# Patient Record
Sex: Male | Born: 1970 | Race: Black or African American | Hispanic: No | Smoking: Former smoker
Health system: Southern US, Community
[De-identification: ages and names within clinical notes are randomized; demographics above are authoritative.]

## PROBLEM LIST (undated history)

## (undated) HISTORY — PX: MOLE REMOVAL: SHX2046

---

## 2015-06-14 ENCOUNTER — Emergency Department (HOSPITAL_COMMUNITY)

## 2015-06-14 ENCOUNTER — Emergency Department (HOSPITAL_COMMUNITY)
Admission: EM | Admit: 2015-06-14 | Discharge: 2015-06-14 | Disposition: A | Discharge status: Home / Self Care | Attending: Emergency Medicine | Admitting: Emergency Medicine

## 2015-06-14 ENCOUNTER — Encounter (HOSPITAL_COMMUNITY): Payer: Self-pay | Admitting: Emergency Medicine

## 2015-06-14 DIAGNOSIS — Z87891 Personal history of nicotine dependence: Secondary | ICD-10-CM | POA: Diagnosis not present

## 2015-06-14 DIAGNOSIS — R109 Unspecified abdominal pain: Secondary | ICD-10-CM

## 2015-06-14 DIAGNOSIS — R1084 Generalized abdominal pain: Secondary | ICD-10-CM | POA: Diagnosis not present

## 2015-06-14 DIAGNOSIS — J159 Unspecified bacterial pneumonia: Secondary | ICD-10-CM | POA: Insufficient documentation

## 2015-06-14 DIAGNOSIS — R112 Nausea with vomiting, unspecified: Secondary | ICD-10-CM

## 2015-06-14 DIAGNOSIS — J189 Pneumonia, unspecified organism: Secondary | ICD-10-CM

## 2015-06-14 DIAGNOSIS — R197 Diarrhea, unspecified: Secondary | ICD-10-CM

## 2015-06-14 LAB — COMPREHENSIVE METABOLIC PANEL
ALK PHOS: 56 U/L (ref 38–126)
ALT: 34 U/L (ref 17–63)
AST: 80 U/L — AB (ref 15–41)
Albumin: 3.7 g/dL (ref 3.5–5.0)
Anion gap: 11 (ref 5–15)
BILIRUBIN TOTAL: 0.4 mg/dL (ref 0.3–1.2)
BUN: 12 mg/dL (ref 6–20)
CALCIUM: 9 mg/dL (ref 8.9–10.3)
CO2: 23 mmol/L (ref 22–32)
CREATININE: 1.3 mg/dL — AB (ref 0.61–1.24)
Chloride: 98 mmol/L — ABNORMAL LOW (ref 101–111)
GFR calc Af Amer: >60 mL/min (ref >60)
Glucose, Bld: 135 mg/dL — ABNORMAL HIGH (ref 65–99)
POTASSIUM: 3.1 mmol/L — AB (ref 3.5–5.1)
Sodium: 132 mmol/L — ABNORMAL LOW (ref 135–145)
TOTAL PROTEIN: 8.4 g/dL — AB (ref 6.5–8.1)

## 2015-06-14 LAB — URINE MICROSCOPIC-ADD ON: Bacteria, UA: NONE SEEN

## 2015-06-14 LAB — URINALYSIS, ROUTINE W REFLEX MICROSCOPIC
Glucose, UA: NEGATIVE mg/dL
LEUKOCYTES UA: NEGATIVE
NITRITE: NEGATIVE
Specific Gravity, Urine: >1.03 — ABNORMAL HIGH (ref 1.005–1.030)
pH: 6 (ref 5.0–8.0)

## 2015-06-14 LAB — LIPASE, BLOOD: Lipase: 23 U/L (ref 11–51)

## 2015-06-14 LAB — CBC
HCT: 37.9 % — ABNORMAL LOW (ref 39.0–52.0)
Hemoglobin: 13.4 g/dL (ref 13.0–17.0)
MCH: 30.1 pg (ref 26.0–34.0)
MCHC: 35.4 g/dL (ref 30.0–36.0)
MCV: 85.2 fL (ref 78.0–100.0)
PLATELETS: 229 10*3/uL (ref 150–400)
RBC: 4.45 MIL/uL (ref 4.22–5.81)
RDW: 13.8 % (ref 11.5–15.5)
WBC: 7.8 10*3/uL (ref 4.0–10.5)

## 2015-06-14 MED ORDER — POTASSIUM CHLORIDE CRYS ER 20 MEQ PO TBCR
40.0000 meq | EXTENDED_RELEASE_TABLET | Freq: Once | ORAL | Status: AC
  Administered 2015-06-14: 40 meq via ORAL
  Filled 2015-06-14: qty 2

## 2015-06-14 MED ORDER — DOXYCYCLINE HYCLATE 100 MG PO TABS
100.0000 mg | ORAL_TABLET | Freq: Once | ORAL | Status: AC
  Administered 2015-06-14: 100 mg via ORAL
  Filled 2015-06-14: qty 1

## 2015-06-14 MED ORDER — ONDANSETRON HCL 4 MG/2ML IJ SOLN
4.0000 mg | INTRAMUSCULAR | Status: DC | PRN
  Administered 2015-06-14: 4 mg via INTRAVENOUS
  Filled 2015-06-14: qty 2

## 2015-06-14 MED ORDER — FAMOTIDINE IN NACL 20-0.9 MG/50ML-% IV SOLN
20.0000 mg | Freq: Once | INTRAVENOUS | Status: AC
  Administered 2015-06-14: 20 mg via INTRAVENOUS
  Filled 2015-06-14: qty 50

## 2015-06-14 MED ORDER — DOXYCYCLINE HYCLATE 100 MG PO TABS: 100.0000 mg | ORAL_TABLET | Freq: Two times a day (BID) | ORAL | Status: AC

## 2015-06-14 MED ORDER — ACETAMINOPHEN 500 MG PO TABS: 1000.0000 mg | ORAL_TABLET | Freq: Once | ORAL | Status: DC

## 2015-06-14 MED ORDER — SODIUM CHLORIDE 0.9 % IV BOLUS (SEPSIS)
1000.0000 mL | Freq: Once | INTRAVENOUS | Status: AC
  Administered 2015-06-14: 1000 mL via INTRAVENOUS

## 2015-06-14 MED ORDER — ONDANSETRON HCL 4 MG PO TABS: 4.0000 mg | ORAL_TABLET | Freq: Three times a day (TID) | ORAL | Status: AC | PRN

## 2015-06-14 MED ORDER — IOHEXOL 300 MG/ML  SOLN
100.0000 mL | Freq: Once | INTRAMUSCULAR | Status: AC | PRN
  Administered 2015-06-14: 100 mL via INTRAVENOUS

## 2015-06-14 MED ORDER — SODIUM CHLORIDE 0.9 % IV SOLN: INTRAVENOUS | Status: DC

## 2015-06-14 MED ORDER — DIATRIZOATE MEGLUMINE & SODIUM 66-10 % PO SOLN
ORAL | Status: AC
  Filled 2015-06-14: qty 30

## 2015-06-14 NOTE — ED Provider Notes (Signed)
CSN: [id number]     Arrival date & time 06/14/15  1349 History   First MD Initiated Contact with Patient 06/14/15 1400     Chief Complaint  Patient presents with  . Abdominal Pain      HPI  Pt was seen at 1430.  Per pt, c/o gradual onset and worsening of persistent generalized abd "pain" for the past 2 to 3 days.  Has been associated with multiple intermittent episodes of N/V/D, decreased PO intake, as well as generalized body aches/fatigue, and subjective fevers/chills.  Describes the abd pain as "sharp." EMS gave IV zofran and PO tylenol en route.  Denies fevers, no back pain, no rash, no CP/SOB, no black or blood in stools or emesis.       History reviewed. No pertinent past medical history.   Past Surgical History  Procedure Laterality Date  . Mole removal      Social History  Substance Use Topics  . Smoking status: Former Games developer  . Smokeless tobacco: None  . Alcohol Use: No    Review of Systems ROS: Statement: All systems negative except as marked or noted in the HPI; Constitutional: +fever and chills. ; ; Eyes: Negative for eye pain, redness and discharge. ; ; ENMT: Negative for ear pain, hoarseness, nasal congestion, sinus pressure and sore throat. ; ; Cardiovascular: Negative for chest pain, palpitations, diaphoresis, dyspnea and peripheral edema. ; ; Respiratory: Negative for cough, wheezing and stridor. ; ; Gastrointestinal: +N/V/D, abd pain. Negative for blood in stool, hematemesis, jaundice and rectal bleeding. . ; ; Genitourinary: Negative for dysuria, flank pain and hematuria. ; ; Musculoskeletal: Negative for back pain and neck pain. Negative for swelling and trauma.; ; Skin: Negative for pruritus, rash, abrasions, blisters, bruising and skin lesion.; ; Neuro: Negative for headache, lightheadedness and neck stiffness. Negative for weakness, altered level of consciousness , altered mental status, extremity weakness, paresthesias, involuntary movement, seizure and syncope.       Allergies  Review of patient's allergies indicates no known allergies.  Home Medications   Prior to Admission medications   Medication Sig Start Date End Date Taking? Authorizing Provider  acetaminophen (TYLENOL) 325 MG tablet Take 650 mg by mouth daily as needed for moderate pain.   Yes Historical Provider, MD  guaiFENesin (ROBITUSSIN) 100 MG/5ML liquid Take 200 mg by mouth 3 (three) times daily as needed for cough or congestion.   Yes Historical Provider, MD  ibuprofen (ADVIL,MOTRIN) 200 MG tablet Take 400 mg by mouth daily as needed for moderate pain.   Yes Historical Provider, MD  Phenylephrine HCl 5 MG TABS Take 2 tablets by mouth 3 (three) times daily as needed (pain).   Yes Historical Provider, MD   BP 143/87 mmHg  Pulse 105  Temp(Src) 103 F (39.4 C) (Oral)  Resp 17  SpO2 96%   BP 141/85 mmHg  Pulse 82  Temp(Src) 98.1 F (36.7 C) (Oral)  Resp 14  SpO2 98%  Physical Exam  1435: Physical examination:  Nursing notes reviewed; Vital signs and O2 SAT reviewed; +febrile.;; Constitutional: Well developed, Well nourished, Well hydrated, In no acute distress; Head:  Normocephalic, atraumatic; Eyes: EOMI, PERRL, No scleral icterus; ENMT: Mouth and pharynx normal, Mucous membranes moist; Neck: Supple, Full range of motion, No lymphadenopathy; Cardiovascular: Tachycardic rate and rhythm, No gallop; Respiratory: Breath sounds clear & equal bilaterally, No wheezes.  Speaking full sentences with ease, Normal respiratory effort/excursion; Chest: Nontender, Movement normal; Abdomen: Soft, +diffuse tenderness to palp. No rebound or guarding.  Nondistended, Normal bowel sounds; Genitourinary: No CVA tenderness; Extremities: Pulses normal, No tenderness, No edema, No calf edema or asymmetry.; Neuro: AA&Ox3, Major CN grossly intact.  Speech clear. No gross focal motor or sensory deficits in extremities.; Skin: Color normal, Warm, Diaphoretic.   ED Course  Procedures (including critical care  time) Labs Review   Imaging Review  I have personally reviewed and evaluated these images and lab results as part of my medical decision-making.   EKG Interpretation None      MDM  MDM Reviewed: nursing note and vitals Interpretation: labs, x-ray and CT scan      Results for orders placed or performed during the hospital encounter of 06/14/15  Lipase, blood  Result Value Ref Range   Lipase 23 11 - 51 U/L  Comprehensive metabolic panel  Result Value Ref Range   Sodium 132 (L) 135 - 145 mmol/L   Potassium 3.1 (L) 3.5 - 5.1 mmol/L   Chloride 98 (L) 101 - 111 mmol/L   CO2 23 22 - 32 mmol/L   Glucose, Bld 135 (H) 65 - 99 mg/dL   BUN 12 6 - 20 mg/dL   Creatinine, Ser 1.61 (H) 0.61 - 1.24 mg/dL   Calcium 9.0 8.9 - 09.6 mg/dL   Total Protein 8.4 (H) 6.5 - 8.1 g/dL   Albumin 3.7 3.5 - 5.0 g/dL   AST 80 (H) 15 - 41 U/L   ALT 34 17 - 63 U/L   Alkaline Phosphatase 56 38 - 126 U/L   Total Bilirubin 0.4 0.3 - 1.2 mg/dL   GFR calc non Af Amer >60 >60 mL/min   GFR calc Af Amer >60 >60 mL/min   Anion gap 11 5 - 15  CBC  Result Value Ref Range   WBC 7.8 4.0 - 10.5 K/uL   RBC 4.45 4.22 - 5.81 MIL/uL   Hemoglobin 13.4 13.0 - 17.0 g/dL   HCT 04.5 (L) 40.9 - 81.1 %   MCV 85.2 78.0 - 100.0 fL   MCH 30.1 26.0 - 34.0 pg   MCHC 35.4 30.0 - 36.0 g/dL   RDW 91.4 78.2 - 95.6 %   Platelets 229 150 - 400 K/uL  Urinalysis, Routine w reflex microscopic (not at [facility])  Result Value Ref Range   Color, Urine AMBER (A) YELLOW   APPearance CLEAR CLEAR   Specific Gravity, Urine >1.030 (H) 1.005 - 1.030   pH 6.0 5.0 - 8.0   Glucose, UA NEGATIVE NEGATIVE mg/dL   Hgb urine dipstick LARGE (A) NEGATIVE   Bilirubin Urine [place] (A) NEGATIVE   Ketones, ur TRACE (A) NEGATIVE mg/dL   Protein, ur >213 (A) NEGATIVE mg/dL   Nitrite NEGATIVE NEGATIVE   Leukocytes, UA NEGATIVE NEGATIVE  Urine microscopic-add on  Result Value Ref Range   Squamous Epithelial / LPF 0-5 (A) NONE SEEN   WBC, UA 0-5 0 - 5  WBC/hpf   RBC / HPF 0-5 0 - 5 RBC/hpf   Bacteria, UA NONE SEEN NONE SEEN   Casts GRANULAR CAST (A) NEGATIVE   Dg Chest 2 View 06/14/2015  CLINICAL DATA:  Acute onset abdominal pain 3 days ago. Fever. Shortness of breath. EXAM: CHEST  2 VIEW COMPARISON:  None. FINDINGS: Heart size and mediastinal contours are within normal limits. Pulmonary airspace disease is seen in the posterior right lower lobe, suspicious for pneumonia. Left lung is clear. No evidence of pleural effusion or pneumothorax. IMPRESSION: Posterior right lower lobe airspace disease, suspicious for pneumonia. Followup PA and lateral  chest X-ray is recommended in 3-4 weeks following trial of antibiotic therapy to ensure resolution and exclude underlying malignancy. Electronically Signed   By: Myles RosenthalJohn  Stahl M.D.   On: 06/14/2015 16:03   Ct Abdomen Pelvis W Contrast 06/14/2015  CLINICAL DATA:  Abdominal pain for 3 days. Nausea and vomiting. Diarrhea. EXAM: CT ABDOMEN AND PELVIS WITH CONTRAST TECHNIQUE: Multidetector CT imaging of the abdomen and pelvis was performed using the standard protocol following bolus administration of intravenous contrast. CONTRAST:  100mL OMNIPAQUE IOHEXOL 300 MG/ML  SOLN COMPARISON:  None. FINDINGS: Lower chest: Pulmonary airspace disease seen in the right lower lobe with central air bronchograms, likely due to pneumonia. No evidence pleural effusion. Hepatobiliary: Probable tiny sub-cm left hepatic lobe cyst noted. No masses or other significant abnormality. Gallbladder is unremarkable. Pancreas: No mass, inflammatory changes, or other significant abnormality. Spleen: Within normal limits in size and appearance. Adrenals/Urinary Tract: No masses identified. No evidence of hydronephrosis. Tiny left upper pole renal cyst noted. Stomach/Bowel: No evidence of obstruction, inflammatory process, or abnormal fluid collections. Normal appendix visualized. Vascular/Lymphatic: No pathologically enlarged lymph nodes. No evidence  of abdominal aortic aneurysm. Reproductive: No mass or other significant abnormality. Other: None. Musculoskeletal:  No suspicious bone lesions identified. IMPRESSION: No acute findings within the abdomen or pelvis. Right lower lobe airspace disease, suspicious for pneumonia. Consider chest radiograph for further evaluation, as well as chest radiographic followup to confirm resolution. Electronically Signed   By: Myles RosenthalJohn  Stahl M.D.   On: 06/14/2015 16:02    1700:  Pt has tol PO well while in the ED without N/V.  No stooling while in the ED.  Abd benign, VSS. Feels better after meds and wants to be discharged now. Potassium repleted PO. Tx with abx for CAP, symptomatically for N/V/D. Dx and testing d/w pt.  Questions answered.  Verb understanding, agreeable to [facility] with outpt f/u.     Samuel JesterKathleen Asad Keeven, DO 06/17/15 1701

## 2015-06-14 NOTE — ED Notes (Signed)
Patient with no complaints at this time. Respirations even and unlabored. Skin warm/dry. Discharge instructions reviewed with patient at this time. Patient given opportunity to voice concerns/ask questions. IV removed per policy and band-aid applied to site. Patient discharged at this time and left Emergency Department with steady gait. [facility] Spoke with Samuella CotaPrice, RN and report/discharge instructions given. Rx and appropriate paperwork given to correctional officer for transport back to facility.

## 2015-06-14 NOTE — ED Notes (Signed)
Patient with diffuse abdominal pain, sharp in nature, since Sunday. Febrile. N/V/D since Sunday. Anorexia since Monday. Alert/oriented. EMS gave 1000 mg Tylenol PO at 1320 and 4 mg Zofran IV.

## 2015-06-14 NOTE — Discharge Instructions (Signed)
°Emergency Department Resource Guide °1) Find a Doctor and Pay Out of Pocket °Although you won't have to find out who is covered by your insurance plan, it is a good idea to ask around and get recommendations. You will then need to call the office and see if the doctor you have chosen will accept you as a new patient and what types of options they offer for patients who are self-pay. Some doctors offer discounts or will set up payment plans for their patients who do not have insurance, but you will need to ask so you aren't surprised when you get to your appointment. ° °2) Contact Your Local Health Department °Not all health departments have doctors that can see patients for sick visits, but many do, so it is worth a call to see if yours does. If you don't know where your local health department is, you can check in your phone book. The CDC also has a tool to help you locate your state's health department, and many state websites also have listings of all of their local health departments. ° °3) Find a Walk-in Clinic °If your illness is not likely to be very severe or complicated, you may want to try a walk in clinic. These are popping up all over the country in pharmacies, drugstores, and shopping centers. They're usually staffed by nurse practitioners or physician assistants that have been trained to treat common illnesses and complaints. They're usually fairly quick and inexpensive. However, if you have serious medical issues or chronic medical problems, these are probably not your best option. ° °No Primary Care Doctor: °- Call Health Connect at  832-8000 - they can help you locate a primary care doctor that  accepts your insurance, provides certain services, etc. °- Physician Referral Service- 1-800-533-3463 ° °Chronic Pain Problems: °Organization         Address  Phone   Notes  °[facility]  ([phone]) [phone] Patients need to be referred by their primary care doctor.  ° °Medication  Assistance: °Organization         Address  Phone   Notes  °[place] [facility] [address] °[place], [place] [address] (336) 641-8030 --Must be a resident of [place] °-- Must have NO insurance coverage whatsoever (no Medicaid/ Medicare, etc.) °-- The pt. MUST have a primary care doctor that directs their care regularly and follows them in the community °  °[facility]  (866) 331-1348   °[employer]  (888) 892-1162   ° °Agencies that provide inexpensive medical care: °Organization         Address  Phone   Notes  °[facility]  (336) 832-8035   °[facility]    (336) 832-7272   °[facility] [address] °[place], [place] [address] (336) 832-4777   °[facility] [address], °[place] (336) 271-4999   °[facility]    (336) 373-0678   °[facility]    (336) 272-1050   °[facility] ° [address], [place] Phone:  (336) 832-4444, Fax:  (336) 832-4440 Hours of Operation:  9 am - 6 pm, M-F.  Also accepts Medicaid/Medicare and self-pay.  °[facility] ° [address], [place] Phone: (336) [phone], Fax: (336) [phone]. Hours of Operation:  8:30 am - 5:30 pm, M-F.  Also accepts Medicaid and self-pay.  °[facility] [place] [address], [place] Phone: (336) 878-6027   °[facility] [address], [place], [place] (336)723-1848, Ext. 123 Mondays & Thursdays: 7-9 AM.  First 15 patients are seen on a first come, first serve basis. °  ° °Medicaid-accepting [place] Providers: ° °Organization         Address  Phone   Notes  °[facility] [address], [place] (336) [phone] Also accepts self-pay patients.  °[facility] [address], [place] ° (336) 856-9996   °[facility] [address], [place]  (336) 288-8857   °[facility] [address] [place] [address], [place] (336) 299-7000   °Veita Bland [address], [place]  ° (336) 373-1557 Only accepts [place] Access Medicaid patients after they have their name applied to their card.  ° °Self-Pay (no insurance) in [place]: ° °Organization         Address  Phone   Notes  °Sickle Cell Patients, [facility] [address], [place] (336) 832-1970   °[facility] [address], [place] (336) 832-4400   °[facility] ° [address], [place] (336) 992-4800   °[facility]/Dr. Osei-Bonsu ° [address] [place] [address], [place] or [address], [place] (336) [phone] Phone number for both [place] and [place] locations is the same.  °[facility] [address], [place] (336) 299-0000   °[facility] [address] [place] [address], [place] or [address] (336) 852-7530 °(336) 878-2260   °[facility] [address], [place] (336) 350-1642, phone; (336) 294-5005, fax Sees patients 1st and 3rd Saturday of every month.  Must not qualify for public or private insurance (i.e. Medicaid, Medicare, [place] Health Choice, Veterans' Benefits) • Household income should be no more than 200% of the poverty level •The clinic cannot treat you if you are pregnant or think you are pregnant • Sexually transmitted diseases are not treated at the clinic.  ° ° °Dental Care: °Organization         Address  Phone  Notes  °[place] [facility] [address], [place] (336) 641-6152 Accepts children up to age 21 who are enrolled in Medicaid or [place] Health Choice; pregnant women with a Medicaid card; and children who have applied for Medicaid or [place] Health Choice, but were declined, whose parents can pay a reduced fee at time of service.  °[place]  [facility] [place]  [address], [place] (336) 641-7733 Accepts children up to age 21 who are enrolled in Medicaid or [place] Health Choice; pregnant women with a Medicaid card; and children who have applied for Medicaid or [place] Health Choice, but were declined, whose parents can pay a reduced fee at time of service.  °[facility] ° [address], [place] (336) 641-4533 Patients are seen by appointment only. Walk-ins are not accepted. [facility] will see patients 18 years of age and older. °Monday - Tuesday (8am-5pm) °Most Wednesdays (8:30-5pm) °$30 per visit, cash only  °[facility] ° [address], [place] (336) 641-4533 Patients are seen by appointment only. Walk-ins are not accepted. [facility] will see patients 18 years of age and older. °One   Wednesday Evening (Monthly: Volunteer Based).  $30 per visit, cash only  °[facility]  (919) 537-3737 for adults; Children under age 4, call Graduate Pediatric Dentistry at ([phone]) [phone]. Children aged 4-14, please call ([phone]) [phone] to request a pediatric application. ° Dental services are provided in all areas of dental care including fillings, crowns and bridges, complete and partial dentures, implants, gum treatment, root canals, and extractions. Preventive care is also provided. Treatment is provided to both adults and children. °Patients are selected via a lottery and there is often a waiting list. °  °[facility] [address], °[place] ° (336) 763-8833 [email or web address] °  °[facility] [address], [place], [place] (336)723-1848, Ext. 123 Second and Fourth Thursday of each month, opens at 6:30 AM; Clinic ends at 9 AM.  Patients are seen on a first-come first-served basis, and a limited number are seen during each clinic.  ° °[facility] ° [address], [place], [place] (336) 723-7904    Eligibility Requirements °You must have lived in [place], [place], or [place] counties for at least the last three months. °  You cannot be eligible for state or federal sponsored healthcare insurance, including Veterans Administration, Medicaid, or Medicare. °  You generally cannot be eligible for healthcare insurance through your employer.  °  How to apply: °Eligibility screenings are held every Tuesday and Wednesday afternoon from 1:00 pm until 4:00 pm. You do not need an appointment for the interview!  °[facility] [address], [place], [place] 336-631-2330   °[place] [facility]  336-342-8273   °[facility]  336-703-3100   °[facility]  336-570-6415   ° °Behavioral Health Resources in the Community: °Intensive Outpatient Programs °Organization         Address  Phone  Notes  °[facility] [address], [place], [place] 336-878-6098   °[facility] [address], [place], [place] 336-832-9800   °[facility] [address], [place], [place] ° 336-882-2125   °[facility] [address],  °[place], [place] 1-800-853-5163 or 336-641-4981   °Substance Abuse Resources °Organization         Address  Phone  Notes  °[facility]  336-882-2125   °[facility]  336-784-9470   °[facility]  336-285-9073   °[facility]  336-845-3988   °Residential & Outpatient Substance Abuse Program  1-800-659-3381   °Psychological Services °Organization         Address  Phone  Notes  °[facility]  336- 832-9600   °[facility]  336- 378-7881   °[facility] [address], [place] 1-800-853-5163 or 336-641-4981   ° °Mobile Crisis Teams °Organization         Address  Phone  Notes  °[facility], Mobile Crisis Care Unit  1-877-626-1772   °Assertive °[facility] ° [address]  [place], [place] 336-834-9664   °Sharon DeEsch [address] °[place] 336-554-5454   ° °Self-Help/Support Groups °Organization         Address  Phone             Notes  °[facility]oc[facility] of  - variety of support groups  336- 373-1402 Call for more information  °Narcotics Anonymous (NA), [facility] [address], °[place]  2 meetings at this location  ° °  Residential Treatment Programs Organization         Address  Phone  Notes  [facility] [address],    [place]  [phone]   [facility]  [address], [place] [phone], [place], [place] [phone]   [facility] [address], [place] [phone] Admissions: 8am-3pm M-F  [facility] [address]    [place], [place] [phone]   [facility] [address], [place], [place] [phone]   [facility] [address]  [place], [place] [phone]   [facility] Intensive Outpatient [address], [place], [place] [phone]   [facility] ([facility] [address]  [place], [place] [phone] or [phone]   [facility] ([facility]) [address] [place], [place] [phone] Accepts Medicaid  [facility] [address]  [place] [phone] Substance Abuse/Addiction Treatment   [facility] Organization         Address  Phone  Notes  [facility]  346-028-2535([phone]) [phone]   Angie FavaJulie Brannon, PhD [address], [address] [place], [place]   [phone]) [phone] or [phone](336) [phone]   [facility]   [address] [place], [place] ([phone](336) [phone]   [facility] [address], [place], [place] [phone](336) [phone] Insurance/Medicaid/sponsorship through [facility] [address]                                    [place], [place] [phone]) [phone] Therapy/tele-psych/case    [facility] [address]   [place], [place] [phone]) [phone]    Dr. Lolly MustacheArfeen  [phone]) [phone]   [facility]  [employer] [facility] 1) [address], [place] 2) [address], [place] 3)  [address], [place] [phone](336) [phone](336) [phone]  [phone](336) [phone]   [facility] Child Abuse Hot[facility] 248-383-6520(336) (856)789-2425 or [phone](336) [phone] (After Hours)      Take the prescriptions as directed.  Increase your fluid intake (ie:  Gatoraide) for the next few days, as discussed.  Eat a bland diet and advance to your regular diet slowly as you can tolerate it.   Avoid full strength juices, as well as milk and milk products until your diarrhea has resolved.  Call your regular medical doctor tomorrow to schedule a follow up appointment in the next 2 days.  Return to the Emergency Department immediately if not improving (or even worsening) despite taking the medicines as prescribed, any black or bloody stool or vomit, or for any other concerns.

## 2017-05-30 IMAGING — CT CT ABD-PELV W/ CM
2 of 5 series · 16 of 46 positions shown, 18 images · IV contrast (omnipaque)
Comparison: None.

CLINICAL DATA: Abdominal pain for 3 days. Nausea and vomiting.
Diarrhea.

EXAM:
CT ABDOMEN AND PELVIS WITH CONTRAST
TECHNIQUE: Multidetector CT imaging of the abdomen and pelvis was performed
using the standard protocol following bolus administration of
intravenous contrast.
CONTRAST:  100mL OMNIPAQUE IOHEXOL 300 MG/ML  SOLN

[Series 2: abd_pel_with 5.0 b40f · axial · 0.70mm/px · z∈[-418,+7]mm · 13 of 97 slices shown, 15 images]
[im 6/97  soft-tissue]
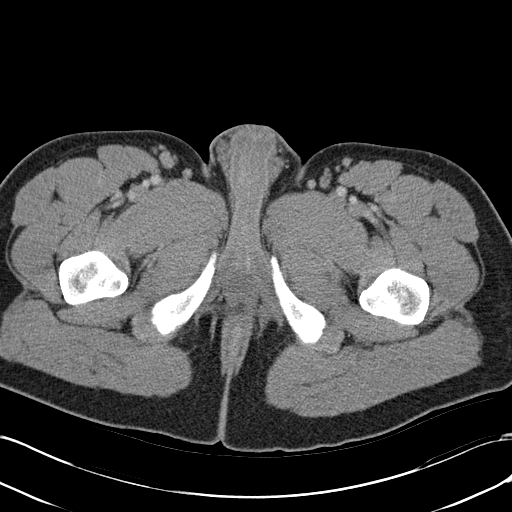
[im 6/97  bone]
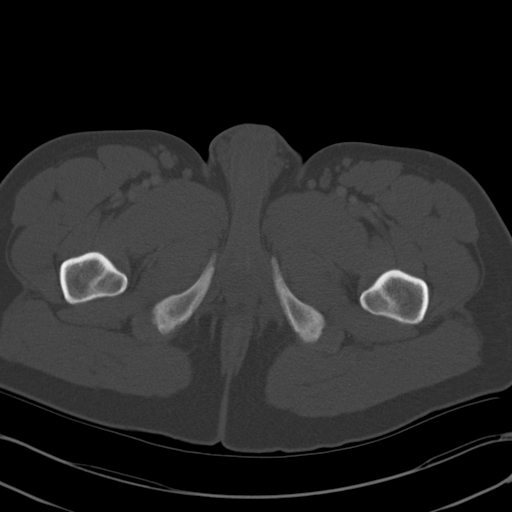
[im 12/97  soft-tissue]
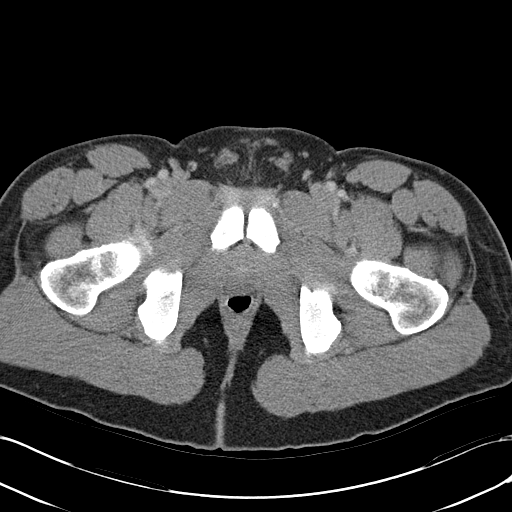
[im 23/97  soft-tissue]
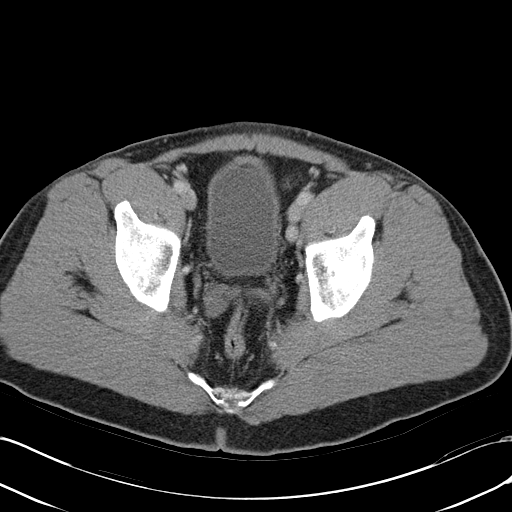
[im 29/97  soft-tissue]
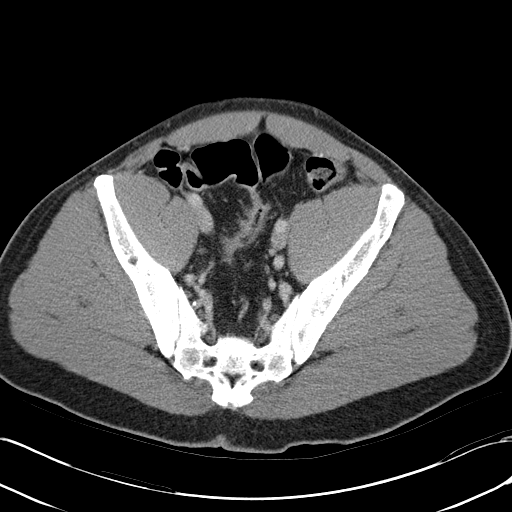
[im 34/97  soft-tissue]
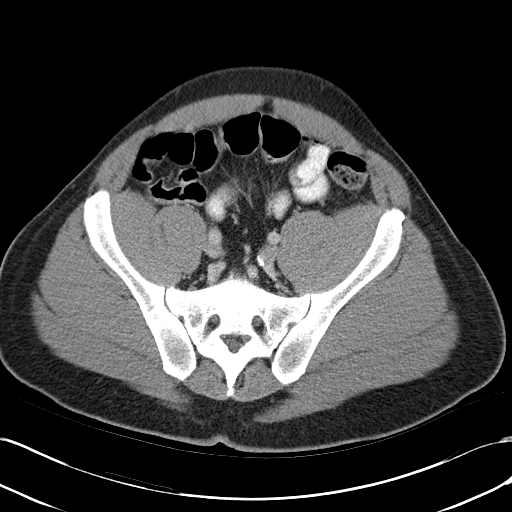
[im 40/97  soft-tissue]
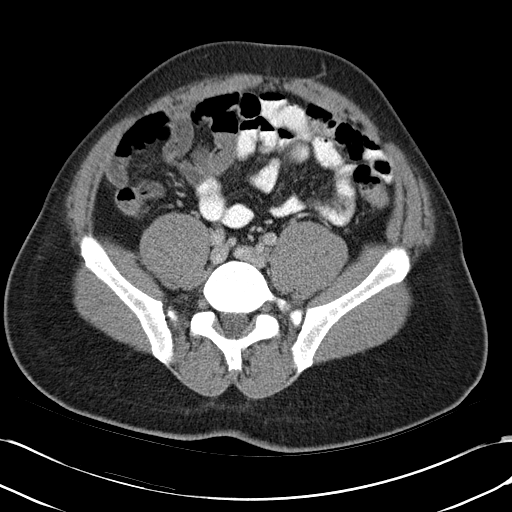
[im 51/97  soft-tissue]
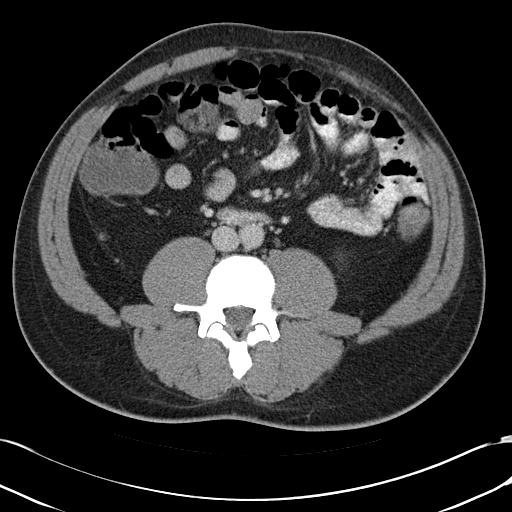
[im 57/97  soft-tissue]
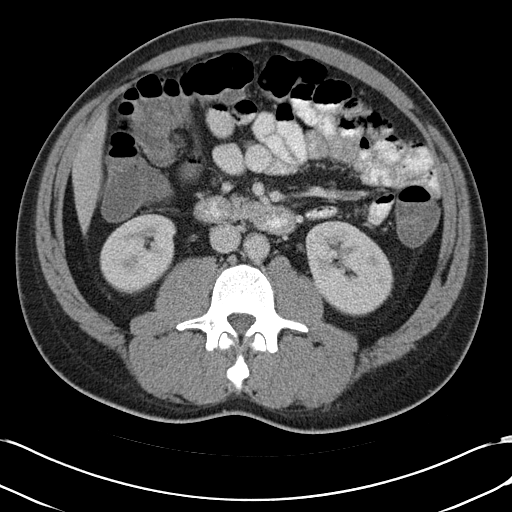
[im 63/97  soft-tissue]
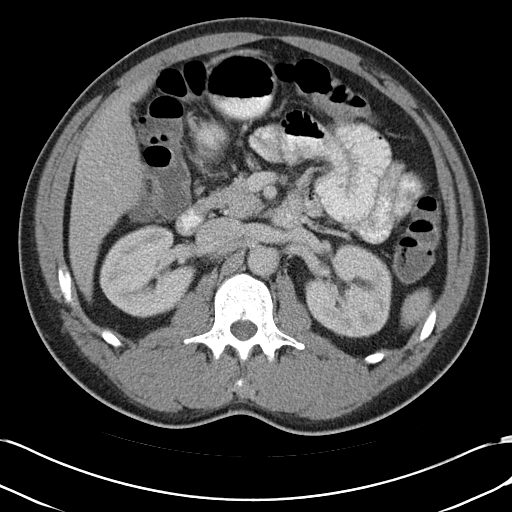
[im 63/97  bone]
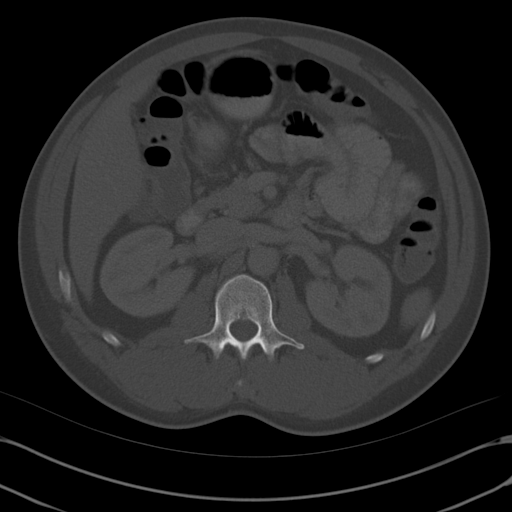
[im 68/97  soft-tissue]
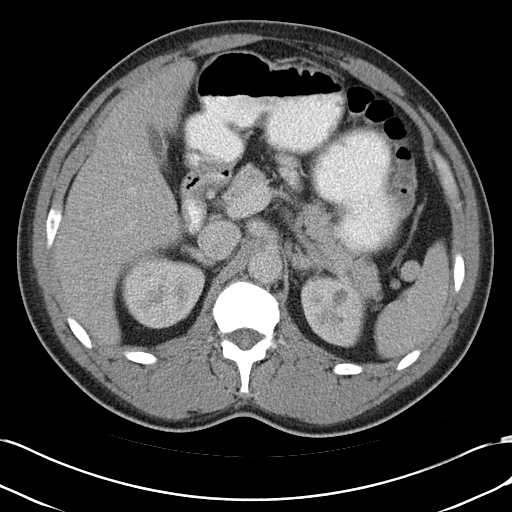
[im 74/97  soft-tissue]
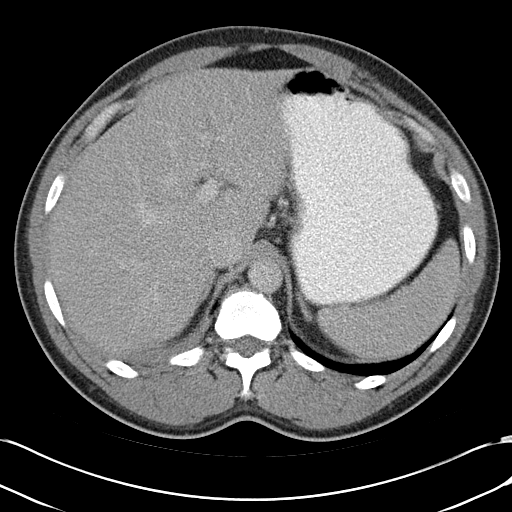
[im 85/97  soft-tissue]
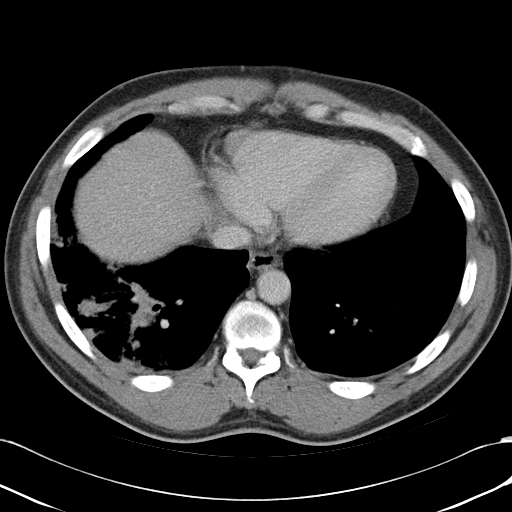
[im 91/97  soft-tissue]
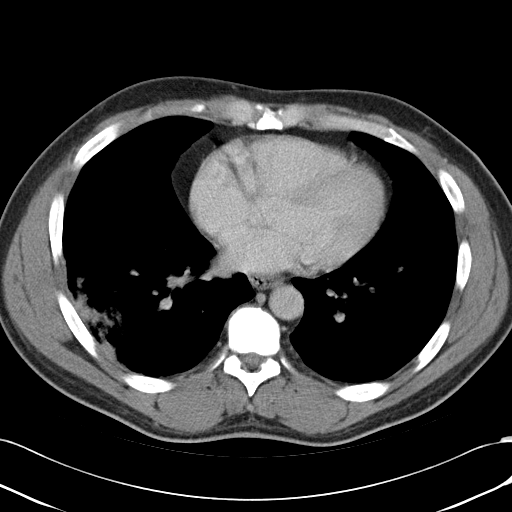

[Series 4: abd_pel_with 3.0 spo · coronal · 0.73mm/px · 3 of 92 slices shown]
[im 31/92  soft-tissue]
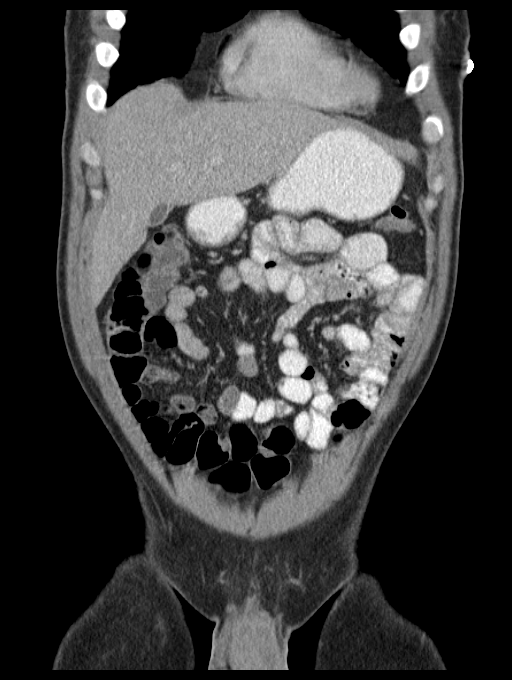
[im 41/92  soft-tissue]
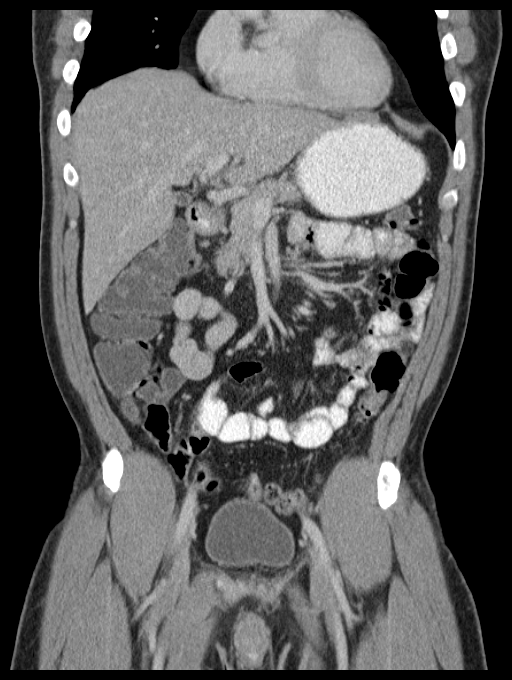
[im 51/92  soft-tissue]
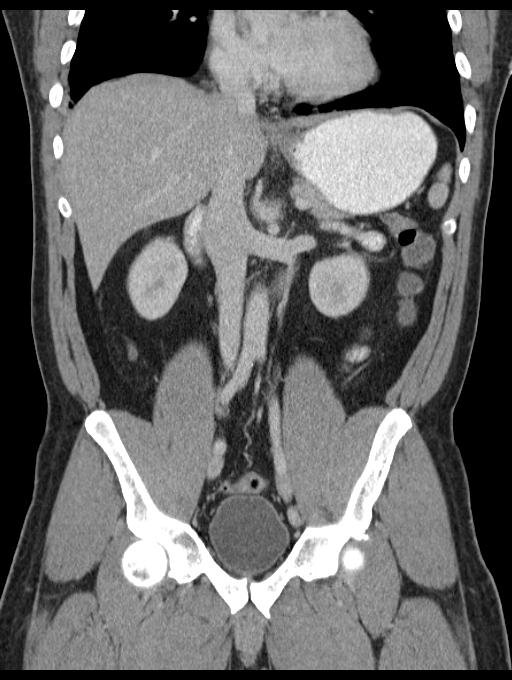

[16 of 46 positions shown; findings below may reference images not displayed]

FINDINGS: Lower chest: Pulmonary airspace disease seen in the right lower lobe
with central air bronchograms, likely due to pneumonia. No evidence
pleural effusion.

Hepatobiliary: Probable tiny sub-cm left hepatic lobe cyst noted. No
masses or other significant abnormality. Gallbladder is
unremarkable.

Pancreas: No mass, inflammatory changes, or other significant
abnormality.

Spleen: Within normal limits in size and appearance.

Adrenals/Urinary Tract: No masses identified. No evidence of
hydronephrosis. Tiny left upper pole renal cyst noted.

Stomach/Bowel: No evidence of obstruction, inflammatory process, or
abnormal fluid collections. Normal appendix visualized.

Vascular/Lymphatic: No pathologically enlarged lymph nodes. No
evidence of abdominal aortic aneurysm.

Reproductive: No mass or other significant abnormality.

Other: None.

Musculoskeletal:  No suspicious bone lesions identified.
IMPRESSION: No acute findings within the abdomen or pelvis.

Right lower lobe airspace disease, suspicious for pneumonia.
Consider chest radiograph for further evaluation, as well as chest
radiographic followup to confirm resolution.
